# Patient Record
Sex: Female | Born: 1970 | Hispanic: No | Marital: Married | Smoking: Never smoker
Health system: Southern US, Community
[De-identification: ages and names within clinical notes are randomized; demographics above are authoritative.]

---

## 2015-05-25 ENCOUNTER — Encounter: Payer: Self-pay | Admitting: Certified Nurse Midwife

## 2015-05-25 ENCOUNTER — Ambulatory Visit (INDEPENDENT_AMBULATORY_CARE_PROVIDER_SITE_OTHER): Payer: 59 | Admitting: Certified Nurse Midwife

## 2015-05-25 VITALS — BP 128/87 | HR 62 | Temp 97.5°F | Ht <= 58 in | Wt 108.8 lb

## 2015-05-25 DIAGNOSIS — N946 Dysmenorrhea, unspecified: Secondary | ICD-10-CM | POA: Diagnosis not present

## 2015-05-25 DIAGNOSIS — Z01419 Encounter for gynecological examination (general) (routine) without abnormal findings: Secondary | ICD-10-CM | POA: Diagnosis not present

## 2015-05-25 DIAGNOSIS — N939 Abnormal uterine and vaginal bleeding, unspecified: Secondary | ICD-10-CM | POA: Insufficient documentation

## 2015-05-25 DIAGNOSIS — Z113 Encounter for screening for infections with a predominantly sexual mode of transmission: Secondary | ICD-10-CM

## 2015-05-25 DIAGNOSIS — N924 Excessive bleeding in the premenopausal period: Secondary | ICD-10-CM

## 2015-05-25 LAB — TSH: TSH: 4.107 u[IU]/mL (ref 0.350–4.500)

## 2015-05-25 LAB — HEPATITIS C ANTIBODY: HCV Ab: NEGATIVE

## 2015-05-25 LAB — HEPATITIS B SURFACE ANTIGEN: HEP B S AG: NEGATIVE

## 2015-05-25 NOTE — Addendum Note (Signed)
Addended by: Carole Binning on: 05/25/2015 04:08 PM   Modules accepted: Orders

## 2015-05-25 NOTE — Progress Notes (Signed)
Patient ID: Connie Austin, female   DOB: October 24, 1970, 44 y.o.   MRN: [id number]    Subjective:      Connie Austin is a 44 y.o. female here for a routine exam.  Current complaints: irregular cycles/bleeding has been having long menses with shorter stops between cycles with heavy bleeding, clots and dysmenorrhea for 10 months-1 year.  Has been in [place] for about 25 days from [place].      Personal health questionnaire:  Is patient Ashkenazi Jewish, have a family history of breast and/or ovarian cancer: no Is there a family history of uterine cancer diagnosed at age < 62, gastrointestinal cancer, urinary tract cancer, family member who is a Field seismologist syndrome-associated carrier: no Is the patient overweight and hypertensive, family history of diabetes, personal history of gestational diabetes, preeclampsia or PCOS: no Is patient over 35, have PCOS,  family history of premature CHD under age 46, diabetes, smoke, have hypertension or peripheral artery disease:  Yes, Father, Sister, HTN; DM on both sides; MGM: CVA At any time, has a partner hit, kicked or otherwise hurt or frightened you?: no Over the past 2 weeks, have you felt down, depressed or hopeless?: no Over the past 2 weeks, have you felt little interest or pleasure in doing things?:no   Gynecologic History Patient's last menstrual period was 05/02/2015 (approximate). Contraception: none Last Pap: unknown. Results were: normal according to the patient Last mammogram: N/A. Results were: normal according to the patient  Obstetric History OB History  No data available  [id number]: 44 years of age  History reviewed. No pertinent past medical history.  History reviewed. No pertinent past surgical history.   Current outpatient prescriptions:  .  antiseptic oral rinse (BIOTENE) LIQD, 15 mLs by Mouth Rinse route as needed for dry mouth., Disp: , Rfl:  Not on File  History  Substance Use Topics  . Smoking status: Never Smoker   .  Smokeless tobacco: Not on file  . Alcohol Use: Not on file    Family History  Problem Relation Age of Onset  . Cancer Father   . Cancer Maternal Aunt   . Cancer Paternal Grandfather       Review of Systems  Constitutional: negative for fatigue and weight loss Respiratory: negative for cough and wheezing Cardiovascular: negative for chest pain, fatigue and palpitations Gastrointestinal: negative for abdominal pain and change in bowel habits Musculoskeletal:negative for myalgias Neurological: negative for gait problems and tremors Behavioral/Psych: negative for abusive relationship, depression Endocrine: negative for temperature intolerance   Genitourinary:negative for genital lesions, hot flashes, sexual problems and vaginal discharge.  + for abnormal menstrual periods Integument/breast: negative for breast lump, breast tenderness, nipple discharge and skin lesion(s)    Objective:       BP 128/87 mmHg  Pulse 62  Temp(Src) 97.5 F (36.4 C)  Ht 4\' 7"  (1.397 m)  Wt 108 lb 12.8 oz (49.351 kg)  BMI 25.29 kg/m2  LMP 05/02/2015 (Approximate) General:   alert  Skin:   no rash or abnormalities  Lungs:   clear to auscultation bilaterally  Heart:   regular rate and rhythm, S1, S2 normal, no murmur, click, rub or gallop  Breasts:   normal without suspicious masses, skin or nipple changes or axillary nodes  Abdomen:  normal findings: no organomegaly, soft, non-tender and no hernia  Pelvis:  External genitalia: normal general appearance Urinary system: urethral meatus normal and bladder without fullness, nontender Vaginal: normal without tenderness, induration or masses Cervix: normal appearance Adnexa: normal bimanual  exam Uterus: retroverted and non-tender, slightly enlarged in size   Lab Review Urine pregnancy test Labs reviewed yes Radiologic studies reviewed no  50% of 30 min visit spent on counseling and coordination of care.   Assessment:    Healthy female exam.    AUB Dysmenorrhea Menorrhagia   Plan:    Education reviewed: calcium supplements, depression evaluation, low fat, low cholesterol diet, safe sex/STD prevention, self breast exams, skin cancer screening and weight bearing exercise. Mammogram ordered. Follow up in: 1 month.   Meds ordered this encounter  Medications  . antiseptic oral rinse (BIOTENE) LIQD    Sig: 15 mLs by Mouth Rinse route as needed for dry mouth.   Orders Placed This Encounter  Procedures  . MM DIGITAL SCREENING BILATERAL    Standing Status: Future     Number of Occurrences:      Standing Expiration Date: 07/24/2016    Order Specific Question:  Reason for Exam (SYMPTOM  OR DIAGNOSIS REQUIRED)    Answer:  annual exam    Order Specific Question:  Is the patient pregnant?    Answer:  No    Order Specific Question:  Preferred imaging location?    Answer:  [facility]  . US Transvaginal Non-OB    Standing Status: Future     Number of Occurrences:      Standing Expiration Date: 07/24/2016    Order Specific Question:  Reason for Exam (SYMPTOM  OR DIAGNOSIS REQUIRED)    Answer:  AUB, menorrhagia    Order Specific Question:  Preferred imaging location?    Answer:  [facility]  . US Pelvis Complete    Standing Status: Future     Number of Occurrences:      Standing Expiration Date: 07/24/2016    Order Specific Question:  Reason for Exam (SYMPTOM  OR DIAGNOSIS REQUIRED)    Answer:  AUB, menorrhagia    Order Specific Question:  Preferred imaging location?    Answer:  [facility]  . HIV antibody (with reflex)  . Hepatitis B surface antigen  . RPR  . Hepatitis C antibody  . TSH  . Prolactin  . Testosterone, Free, Total, SHBG  . 17-Hydroxyprogesterone  . Progesterone  . Estrogens, Total

## 2015-05-26 LAB — PAP IG AND HPV HIGH-RISK: HPV DNA HIGH RISK: NOT DETECTED

## 2015-05-26 LAB — RPR

## 2015-05-26 LAB — PROLACTIN: Prolactin: 7.2 ng/mL

## 2015-05-26 LAB — TESTOSTERONE, FREE, TOTAL, SHBG
Sex Hormone Binding: 52 nmol/L (ref 17–124)
TESTOSTERONE FREE: 3.2 pg/mL (ref 0.6–6.8)
Testosterone-% Free: 1.3 % (ref 0.4–2.4)
Testosterone: 24 ng/dL (ref 10–70)

## 2015-05-26 LAB — HIV ANTIBODY (ROUTINE TESTING W REFLEX): HIV 1&2 Ab, 4th Generation: NONREACTIVE

## 2015-05-26 LAB — PROGESTERONE: Progesterone: 0.4 ng/mL

## 2015-05-28 LAB — SURESWAB, VAGINOSIS/VAGINITIS PLUS
Atopobium vaginae: NOT DETECTED Log (cells/mL)
C. TRACHOMATIS RNA, TMA: NOT DETECTED
C. albicans, DNA: NOT DETECTED
C. glabrata, DNA: NOT DETECTED
C. parapsilosis, DNA: NOT DETECTED
C. tropicalis, DNA: NOT DETECTED
GARDNERELLA VAGINALIS: NOT DETECTED Log (cells/mL)
LACTOBACILLUS SPECIES: NOT DETECTED Log (cells/mL)
MEGASPHAERA SPECIES: NOT DETECTED Log (cells/mL)
N. GONORRHOEAE RNA, TMA: NOT DETECTED
T. VAGINALIS RNA, QL TMA: NOT DETECTED

## 2015-05-28 LAB — 17-HYDROXYPROGESTERONE: 17-OH-Progesterone, LC/MS/MS: <8 ng/dL

## 2015-05-30 ENCOUNTER — Telehealth: Payer: Self-pay

## 2015-05-30 LAB — ESTROGENS, TOTAL: Estrogen: 84 pg/mL

## 2015-05-30 NOTE — Telephone Encounter (Signed)
Patient's insurance was not going to pay all of her u/s and mammogram over at [facility] - can we sch the ultrasound here and mammogram over at [place]?  - was trying to make it convenient for her to have appts same place, as she is new to area. Let me know and I will reschedule and call her.

## 2015-05-31 NOTE — Telephone Encounter (Signed)
That would be fine with me.  Sorry about the insurance.  Thank you.

## 2015-06-01 ENCOUNTER — Telehealth: Payer: Self-pay

## 2015-06-01 NOTE — Telephone Encounter (Signed)
Levada Dy from [place], who referred patient here, asked that Dr. notes be faxed to them - did so to [phone] as requested

## 2015-06-01 NOTE — Telephone Encounter (Signed)
Patient called yesterday wanting to know cost of u/s here at [facility] - per Connie Austin $755.00 - her insurance won't pay all of it, and she wanted to think about having u/s here and will call back  - she also has $1,000 ded to meet - she asked [place] to cancelled u/s at [facility] due to insurance not covering all. She stated she had her PCP schedule mammogram

## 2015-06-03 ENCOUNTER — Telehealth: Payer: Self-pay

## 2015-06-03 NOTE — Telephone Encounter (Signed)
PATIENT CALLED AND ASKED IF WE COULD REDUCE THE COST OF U/S OF $755.00 DONE IN OUR OFFICE  - SPOKE WITH JENNIFER H. AND SHE SAID WE COULDNT, BUT COULD SET HER UP ON PMT PLAN FOR WHAT HER INS DOES NOT COVER - PATIENT NOT HAPPY WITH THAT RESPONSE, AND NOT HAPPY NURSE'S [place] RETURNED HER CALLS YET REGARDING LAB RESULTS - TOLD HER THEY WOULD DEFINITELY CALL HER AS SOON AS THEY COULD.

## 2015-06-06 ENCOUNTER — Ambulatory Visit (HOSPITAL_COMMUNITY): Payer: 59

## 2015-06-06 ENCOUNTER — Ambulatory Visit (HOSPITAL_COMMUNITY): Payer: Self-pay

## 2015-06-07 ENCOUNTER — Ambulatory Visit: Payer: 59 | Admitting: Certified Nurse Midwife

## 2015-06-08 ENCOUNTER — Ambulatory Visit: Payer: 59 | Admitting: Certified Nurse Midwife

## 2015-06-16 ENCOUNTER — Ambulatory Visit (INDEPENDENT_AMBULATORY_CARE_PROVIDER_SITE_OTHER): Payer: 59

## 2015-06-16 DIAGNOSIS — N939 Abnormal uterine and vaginal bleeding, unspecified: Secondary | ICD-10-CM | POA: Diagnosis not present

## 2015-06-16 DIAGNOSIS — N924 Excessive bleeding in the premenopausal period: Secondary | ICD-10-CM

## 2015-06-16 DIAGNOSIS — N946 Dysmenorrhea, unspecified: Secondary | ICD-10-CM

## 2015-06-22 ENCOUNTER — Ambulatory Visit (INDEPENDENT_AMBULATORY_CARE_PROVIDER_SITE_OTHER): Payer: 59 | Admitting: Obstetrics

## 2015-06-22 ENCOUNTER — Telehealth: Payer: Self-pay

## 2015-06-22 ENCOUNTER — Encounter: Payer: Self-pay | Admitting: Obstetrics

## 2015-06-22 VITALS — BP 144/90 | HR 66 | Temp 98.1°F | Wt 107.0 lb

## 2015-06-22 DIAGNOSIS — D251 Intramural leiomyoma of uterus: Secondary | ICD-10-CM

## 2015-06-22 NOTE — Progress Notes (Signed)
Patient presents for results of ultrasound. Ultrasound results discussed. Patient wants female Editor, commissioning, which is not available at [facility].    A/P: Symptomatic uterine fibroids.  Patient considering options.  Baltazar Najjar MD

## 2015-06-22 NOTE — Telephone Encounter (Signed)
HAVE PATIENT ALL HER MEDICAL RECORDS TO TAKE WITH HER - LABS AND NOTES - PRINTED THEM OUT - SHE SIGNED A RELEASE

## 2015-12-28 ENCOUNTER — Other Ambulatory Visit: Payer: Self-pay | Admitting: Obstetrics and Gynecology

## 2015-12-28 DIAGNOSIS — D259 Leiomyoma of uterus, unspecified: Secondary | ICD-10-CM

## 2016-01-07 ENCOUNTER — Ambulatory Visit
Admission: RE | Admit: 2016-01-07 | Discharge: 2016-01-07 | Disposition: A | Discharge status: Home / Self Care | Payer: 59 | Source: Ambulatory Visit | Attending: Obstetrics and Gynecology | Admitting: Obstetrics and Gynecology

## 2016-01-07 DIAGNOSIS — D259 Leiomyoma of uterus, unspecified: Secondary | ICD-10-CM

## 2016-01-07 MED ORDER — GADOBENATE DIMEGLUMINE 529 MG/ML IV SOLN
8.0000 mL | Freq: Once | INTRAVENOUS | Status: AC | PRN
  Administered 2016-01-07: 8 mL via INTRAVENOUS

## 2017-09-29 IMAGING — MR MR PELVIS WO/W CM
8 of 10 series · 38 of 48 positions shown · IV contrast (multihance)
Comparison: None.

CLINICAL DATA: 44-year-old female with reported history of uterine
fibroids and irregular menses.

EXAM:
MRI PELVIS WITHOUT AND WITH CONTRAST
TECHNIQUE: Multiplanar multisequence MR imaging of the pelvis was performed
both before and after administration of intravenous contrast.
CONTRAST:  8mL MULTIHANCE GADOBENATE DIMEGLUMINE 529 MG/ML IV SOLN

[Series 2: T2 · coronal · 5.0mm · 1.25mm/px · 2 of 25 slices shown (1 of 3)]
[im 1/25]
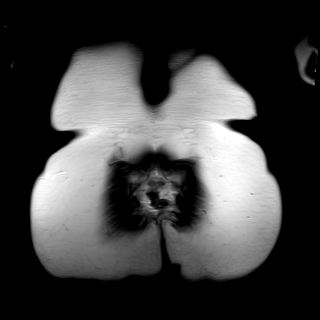
[im 25/25]
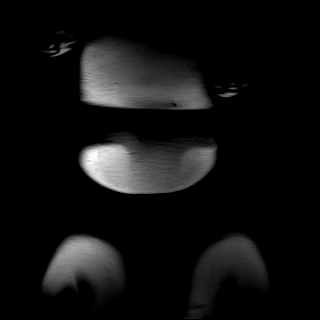

[Series 3: T2 · sagittal · 5.0mm · 0.78mm/px · 1 of 25 slices shown (2 of 3)]
[im 1/25]
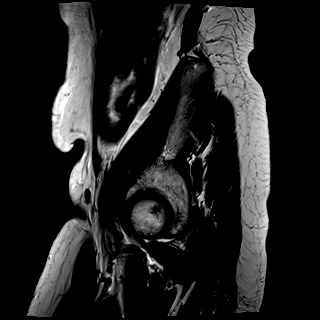

[Series 4: T2 · axial · 5.0mm · 0.41mm/px · z∈[+6,+180]mm · 2 of 30 slices shown (3 of 3)]
[im 1/30]
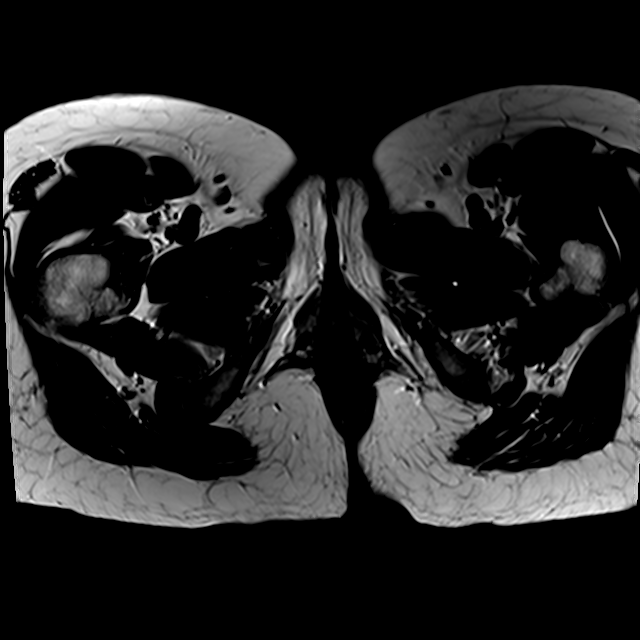
[im 30/30]
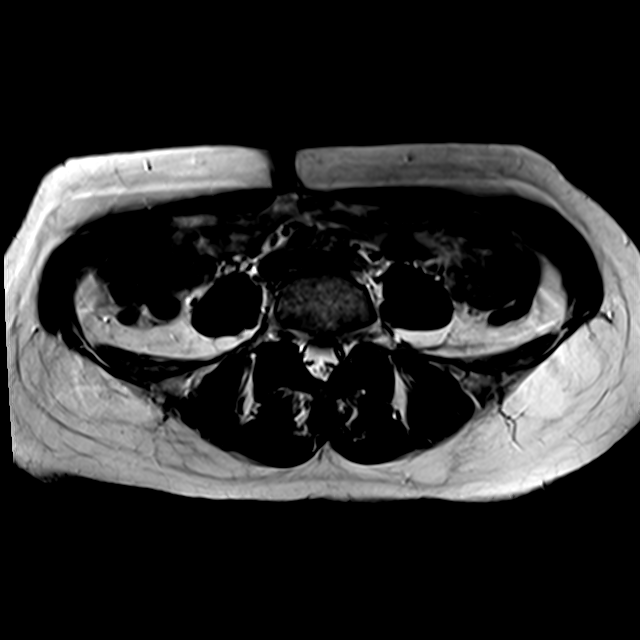

[Series 5: T2 fat-sat · axial · 5.0mm · 0.41mm/px · z∈[+6,+180]mm · 2 of 30 slices shown]
[im 1/30]
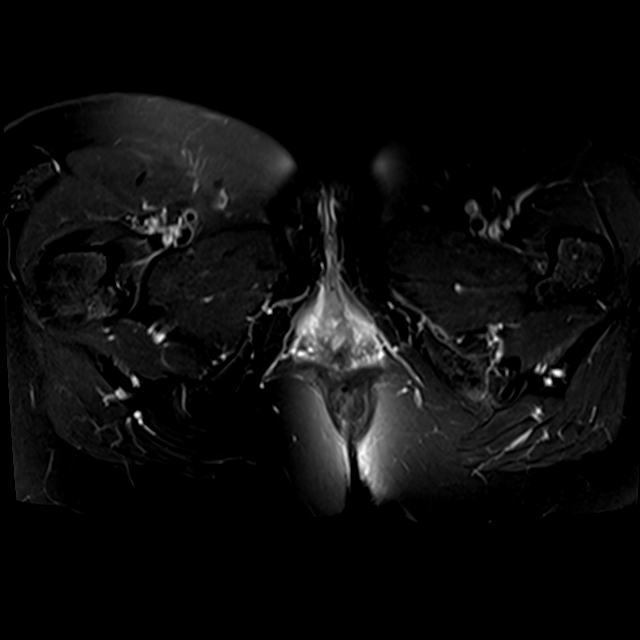
[im 30/30]
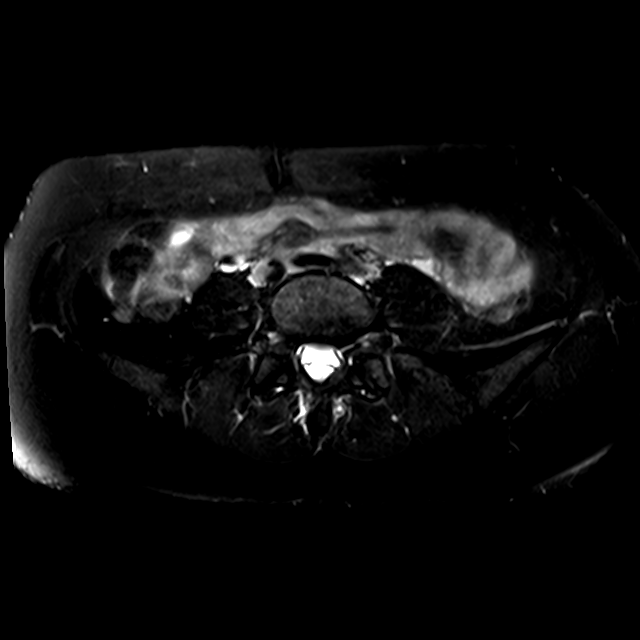

[Series 6: T1 · axial · 1.2mm · 0.75mm/px · z∈[+7,+179]mm · 8 of 144 slices shown]
[im 1/144]
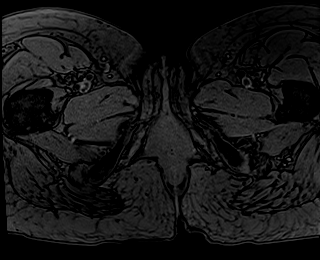
[im 21/144]
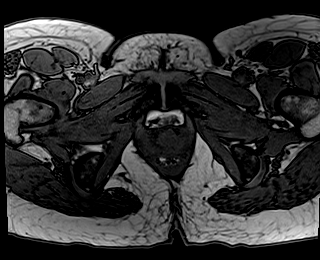
[im 41/144]
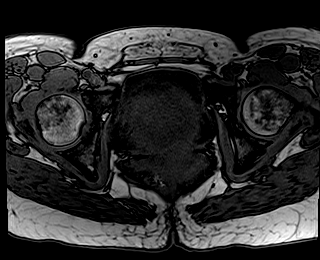
[im 62/144]
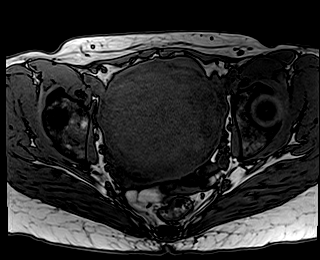
[im 82/144]
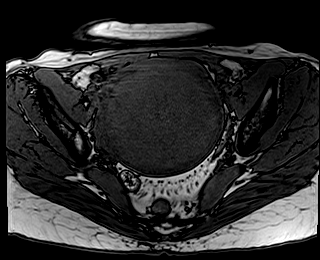
[im 103/144]
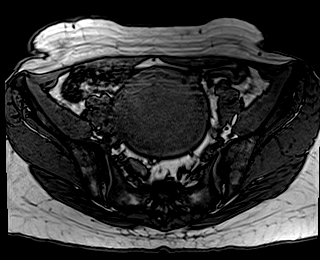
[im 123/144]
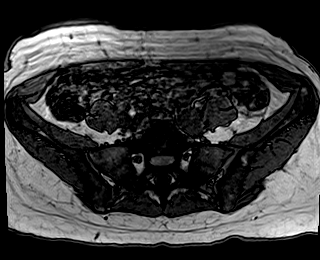
[im 144/144]
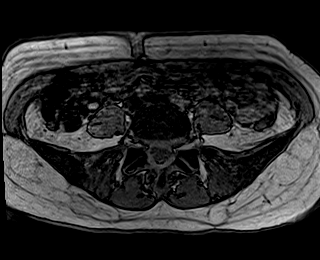

[Series 7: T1 fat-sat · axial · 1.2mm · 0.75mm/px · z∈[+7,+179]mm · 8 of 144 slices shown]
[im 1/144]
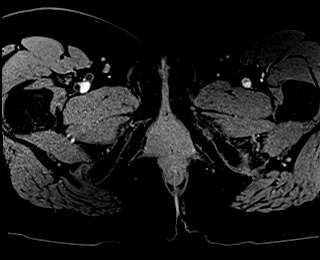
[im 21/144]
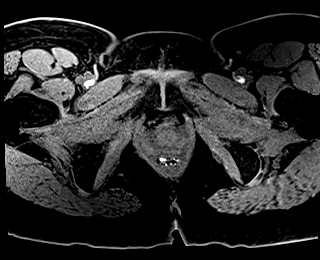
[im 41/144]
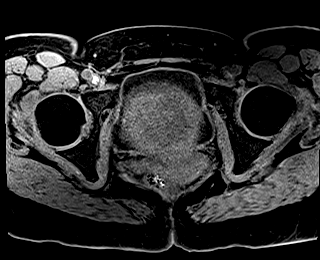
[im 62/144]
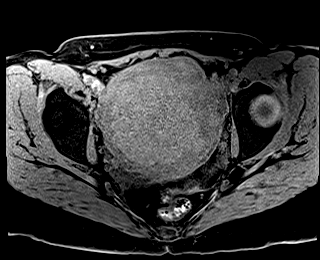
[im 82/144]
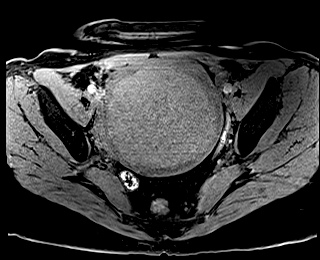
[im 103/144]
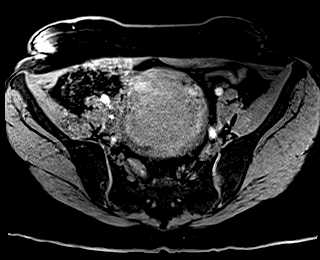
[im 123/144]
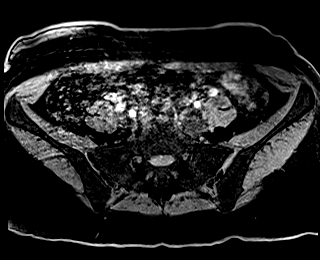
[im 144/144]
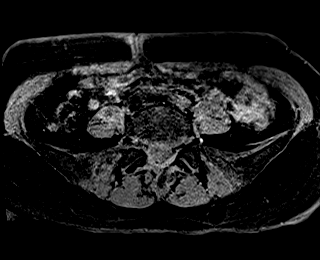

[Series 8: T1 fat-sat post-contrast · axial · 1.2mm · 0.75mm/px · z∈[+7,+179]mm · 8 of 144 slices shown (1 of 2)]
[im 1/144]
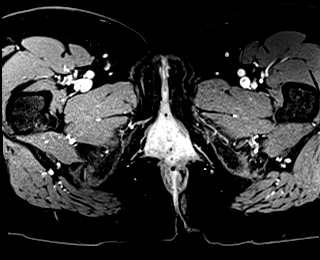
[im 21/144]
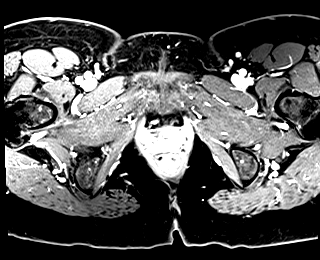
[im 41/144]
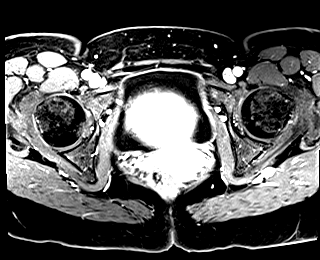
[im 62/144]
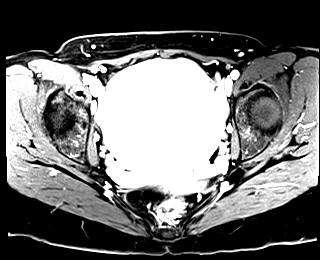
[im 82/144]
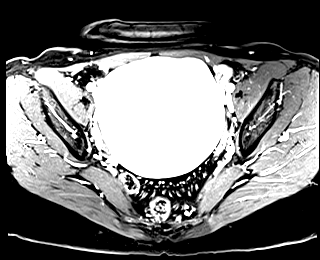
[im 103/144]
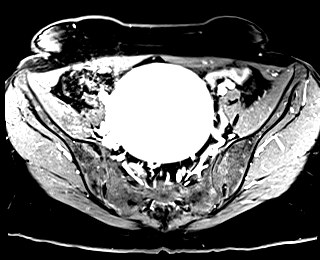
[im 123/144]
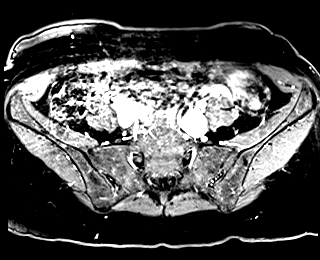
[im 144/144]
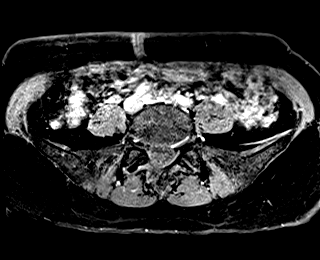

[Series 9: T1 fat-sat post-contrast · axial · 1.2mm · 0.75mm/px · z∈[+7,+153]mm · 7 of 144 slices shown (2 of 2)]
[im 1/144]
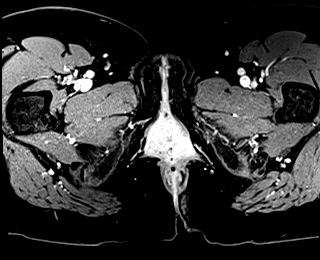
[im 21/144]
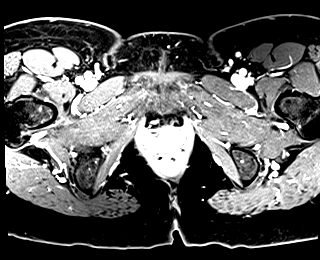
[im 41/144]
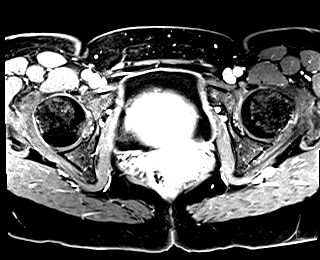
[im 62/144]
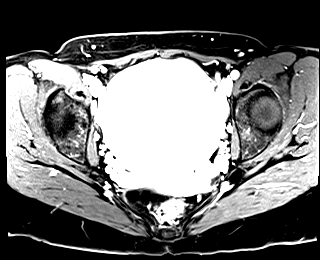
[im 82/144]
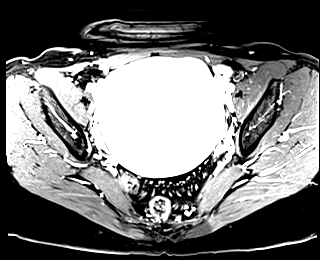
[im 103/144]
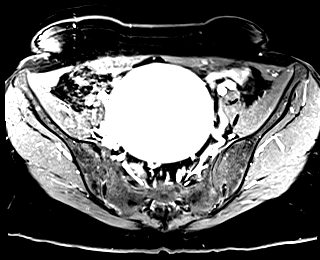
[im 123/144]
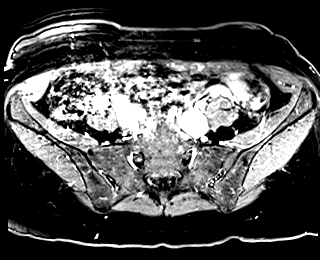

[38 of 48 positions shown; findings below may reference images not displayed]

FINDINGS: Uterus: The moderately enlarged anteverted anteflexed uterus
measures 10.7 x 9.0 x 9.3 cm, for uterine volume of 466 cc. There
are 3 uterine fibroids as follows:

- posterior uterine body transmural 8.7 x 8.4 x 8.5 cm fibroid,
which demonstrates a small focus of central hemorrhagic internal
generation and otherwise avidly enhances

- left posterior lower uterine segment intramural 1.1 x 1.0 x 1.5 cm
fibroid, which demonstrates uniform avid enhancement

- left anterior fundal 2.5 x 1.2 x 2.4 cm subserosal fibroid, which
demonstrates uniform avid enhancement

Inner myometrium (junctional zone) measures 4 mm in thickness, which
is within normal limits. Endometrium measures 3 mm in bilayer
thickness, which is within normal limits. No endometrial cavity
fluid or focal endometrial mass. The cervix and vagina appear
unremarkable.

Ovaries and Adnexa: The right ovary measures 3.0 x 2.0 x 1.2 cm and
contains a simple 1.6 x 0.8 x 0.7 cm cyst, consistent with a
dominant follicle. The left ovary measures 1.7 x 1.5 x 1.1 cm and is
normal. There are no suspicious ovarian or adnexal masses.

Bladder: Normal.  Normal visualized urethra.

Bowel: Visualized small and large bowel are normal caliber with no
bowel wall thickening.

Vascular/Lymphatic: No pathologically enlarged lymph nodes in the
pelvis.

Other: No abnormal free fluid in the pelvis. No focal pelvic fluid
collection. Partially visualized is a 6.1 cm renal cyst in the upper
right kidney on the coronal sequence, which is incompletely
evaluated on this scan and appears simple on the visualized images.

Musculoskeletal: No aggressive appearing focal osseous lesions. Mild
degenerative disc disease at L5-S1 with broad posterior disc bulge.
IMPRESSION: 1. Moderately enlarged myomatous uterus with a dominant 8.7 cm
posterior uterine body transmural fibroid, which demonstrates
partial central hemorrhagic degeneration. No intracavitary or
pedunculated fibroids.
2. No endometrial abnormality.
3. No suspicious ovarian or adnexal findings.
4. Incompletely evaluated 6.1 cm upper right renal cyst, which
appears simple on the visualized images. Recommend correlation with
renal sonography.

## 2020-05-16 ENCOUNTER — Other Ambulatory Visit: Payer: 59

## 2024-07-24 ENCOUNTER — Other Ambulatory Visit: Payer: Self-pay | Admitting: Obstetrics and Gynecology

## 2024-07-24 DIAGNOSIS — Z1231 Encounter for screening mammogram for malignant neoplasm of breast: Secondary | ICD-10-CM
